# Patient Record
Sex: Male | Born: 1980 | Race: White | Hispanic: No | Marital: Single | Smoking: Current every day smoker
Health system: Southern US, Community
[De-identification: ages and names within clinical notes are randomized; demographics above are authoritative.]

## PROBLEM LIST (undated history)

## (undated) DIAGNOSIS — F4310 Post-traumatic stress disorder, unspecified: Secondary | ICD-10-CM

## (undated) DIAGNOSIS — K37 Unspecified appendicitis: Secondary | ICD-10-CM

## (undated) DIAGNOSIS — F1910 Other psychoactive substance abuse, uncomplicated: Secondary | ICD-10-CM

## (undated) HISTORY — PX: APPENDECTOMY: SHX54

## (undated) HISTORY — PX: ADENOIDECTOMY: SUR15

## (undated) HISTORY — PX: NEPHRECTOMY: SHX65

---

## 2016-05-15 ENCOUNTER — Encounter (HOSPITAL_COMMUNITY): Payer: Self-pay | Admitting: Emergency Medicine

## 2016-05-15 ENCOUNTER — Emergency Department (HOSPITAL_COMMUNITY)
Admission: EM | Admit: 2016-05-15 | Discharge: 2016-05-15 | Disposition: A | Discharge status: Home / Self Care | Payer: Self-pay | Attending: Emergency Medicine | Admitting: Emergency Medicine

## 2016-05-15 ENCOUNTER — Emergency Department (HOSPITAL_COMMUNITY): Payer: Self-pay

## 2016-05-15 DIAGNOSIS — F17210 Nicotine dependence, cigarettes, uncomplicated: Secondary | ICD-10-CM | POA: Insufficient documentation

## 2016-05-15 DIAGNOSIS — F10229 Alcohol dependence with intoxication, unspecified: Secondary | ICD-10-CM | POA: Insufficient documentation

## 2016-05-15 DIAGNOSIS — F1123 Opioid dependence with withdrawal: Secondary | ICD-10-CM | POA: Insufficient documentation

## 2016-05-15 DIAGNOSIS — Z79899 Other long term (current) drug therapy: Secondary | ICD-10-CM | POA: Insufficient documentation

## 2016-05-15 DIAGNOSIS — F1910 Other psychoactive substance abuse, uncomplicated: Secondary | ICD-10-CM

## 2016-05-15 HISTORY — DX: Other psychoactive substance abuse, uncomplicated: F19.10

## 2016-05-15 HISTORY — DX: Unspecified appendicitis: K37

## 2016-05-15 LAB — CBC
HEMATOCRIT: 46 % (ref 39.0–52.0)
HEMOGLOBIN: 16.1 g/dL (ref 13.0–17.0)
MCH: 31.8 pg (ref 26.0–34.0)
MCHC: 35 g/dL (ref 30.0–36.0)
MCV: 90.9 fL (ref 78.0–100.0)
Platelets: 438 10*3/uL — ABNORMAL HIGH (ref 150–400)
RBC: 5.06 MIL/uL (ref 4.22–5.81)
RDW: 13.9 % (ref 11.5–15.5)
WBC: 15.4 10*3/uL — ABNORMAL HIGH (ref 4.0–10.5)

## 2016-05-15 LAB — ETHANOL: Alcohol, Ethyl (B): 404 mg/dL (ref <5)

## 2016-05-15 LAB — COMPREHENSIVE METABOLIC PANEL
ALBUMIN: 4.6 g/dL (ref 3.5–5.0)
ALT: 32 U/L (ref 17–63)
AST: 31 U/L (ref 15–41)
Alkaline Phosphatase: 113 U/L (ref 38–126)
Anion gap: 15 (ref 5–15)
BILIRUBIN TOTAL: 0.5 mg/dL (ref 0.3–1.2)
BUN: 8 mg/dL (ref 6–20)
CHLORIDE: 103 mmol/L (ref 101–111)
CO2: 29 mmol/L (ref 22–32)
Calcium: 9.1 mg/dL (ref 8.9–10.3)
Creatinine, Ser: 0.59 mg/dL — ABNORMAL LOW (ref 0.61–1.24)
GFR calc Af Amer: >60 mL/min (ref >60)
GFR calc non Af Amer: >60 mL/min (ref >60)
Glucose, Bld: 122 mg/dL — ABNORMAL HIGH (ref 65–99)
POTASSIUM: 3.7 mmol/L (ref 3.5–5.1)
Sodium: 147 mmol/L — ABNORMAL HIGH (ref 135–145)
TOTAL PROTEIN: 8.3 g/dL — AB (ref 6.5–8.1)

## 2016-05-15 LAB — TROPONIN I: Troponin I: <0.03 ng/mL (ref <0.03)

## 2016-05-15 LAB — MAGNESIUM: Magnesium: 2.6 mg/dL — ABNORMAL HIGH (ref 1.7–2.4)

## 2016-05-15 MED ORDER — NAPROXEN 375 MG PO TABS: 375.0000 mg | ORAL_TABLET | Freq: Two times a day (BID) | ORAL | 0 refills | Status: DC | PRN

## 2016-05-15 MED ORDER — LOPERAMIDE HCL 2 MG PO CAPS: 2.0000 mg | ORAL_CAPSULE | Freq: Four times a day (QID) | ORAL | 0 refills | Status: DC | PRN

## 2016-05-15 MED ORDER — ONDANSETRON HCL 4 MG PO TABS: 4.0000 mg | ORAL_TABLET | Freq: Three times a day (TID) | ORAL | 0 refills | Status: AC | PRN

## 2016-05-15 MED ORDER — VITAMIN B-1 100 MG PO TABS: 100.0000 mg | ORAL_TABLET | Freq: Every day | ORAL | 0 refills | Status: DC

## 2016-05-15 MED ORDER — NAPROXEN 375 MG PO TABS: 375.0000 mg | ORAL_TABLET | Freq: Two times a day (BID) | ORAL | 0 refills | Status: AC | PRN

## 2016-05-15 MED ORDER — CLONIDINE HCL 0.2 MG PO TABS
0.1000 mg | ORAL_TABLET | Freq: Two times a day (BID) | ORAL | 0 refills | Status: DC
Start: 2016-05-15 — End: 2016-05-15

## 2016-05-15 MED ORDER — LOPERAMIDE HCL 2 MG PO CAPS: 2.0000 mg | ORAL_CAPSULE | Freq: Four times a day (QID) | ORAL | 0 refills | Status: AC | PRN

## 2016-05-15 MED ORDER — CLONIDINE HCL 0.1 MG PO TABS: 0.1000 mg | ORAL_TABLET | Freq: Two times a day (BID) | ORAL | 0 refills | Status: AC

## 2016-05-15 MED ORDER — ONDANSETRON HCL 4 MG PO TABS: 4.0000 mg | ORAL_TABLET | Freq: Three times a day (TID) | ORAL | 0 refills | Status: DC | PRN

## 2016-05-15 MED ORDER — VITAMIN B-1 100 MG PO TABS: 100.0000 mg | ORAL_TABLET | Freq: Every day | ORAL | 0 refills | Status: AC

## 2016-05-15 NOTE — ED Notes (Signed)
Patient transported to X-ray 

## 2016-05-15 NOTE — ED Notes (Signed)
Pt provided bus pass & directed to bus stop

## 2016-05-15 NOTE — ED Notes (Signed)
Pt states he was [facility] but there are no records seen.  Registration came back and verified patient information.

## 2016-05-15 NOTE — ED Notes (Addendum)
Pt unable to provide urine at this time.  Given scrubs to change into. Pt belongings put in locker 28.

## 2016-05-15 NOTE — Progress Notes (Signed)
CM spoke with pt who [place]cp.  CM discussed and provided written information to assist pt with determining choice for uninsured accepting pcps, discussed the importance of pcp vs EDP services for f/u care, www.needymeds.org, www.goodrx.com, discounted pharmacies and other [place] resources such as [facility] , [facility], affordable care act, financial assistance, uninsured dental services, [place] [facility] assist, DSS and  health department  Reviewed resources for [place] uninsured accepting pcps like [facility], family medicine [facility] [address], [facility], [facility], local urgent care centers, [facility], [facility], general medical clinics, family services of the [place], [facility] urgent care plus others, medication resources, [facility] out [facility]ies and housing Pt voiced understanding and appreciation of resources provided   Provided [facility] contact information Pt states he has recently moved to area 3 months ago

## 2016-05-15 NOTE — ED Notes (Signed)
Pt ambulated in hallway without difficulty, observed by 2 RN's.
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
## 2016-05-15 NOTE — ED Notes (Signed)
Patient resting comfortably.  Equal chest rise and fall noted with non labored breathing

## 2016-05-15 NOTE — ED Triage Notes (Signed)
Pt comes by EMS wants to detox from alcohol, oxycodone, and suboxone. Also complains of left sided abdominal/flank pain.  Denies chest pain.  A&O x4.  Calm and cooperative in route.  [facility] CBG 140.  Last drink about an hour ago (vodka).  Denies use of any other substances.

## 2016-05-15 NOTE — ED Notes (Signed)
Patient now requesting to leave.  Made MD aware.  MD to come speak with patient.

## 2016-06-16 ENCOUNTER — Encounter (HOSPITAL_COMMUNITY): Payer: Self-pay

## 2016-06-16 ENCOUNTER — Emergency Department (HOSPITAL_COMMUNITY): Payer: Self-pay

## 2016-06-16 ENCOUNTER — Emergency Department (HOSPITAL_COMMUNITY)
Admission: EM | Admit: 2016-06-16 | Discharge: 2016-06-16 | Disposition: A | Discharge status: Home / Self Care | Payer: Self-pay | Attending: Emergency Medicine | Admitting: Emergency Medicine

## 2016-06-16 DIAGNOSIS — W268XXA Contact with other sharp object(s), not elsewhere classified, initial encounter: Secondary | ICD-10-CM | POA: Insufficient documentation

## 2016-06-16 DIAGNOSIS — Y999 Unspecified external cause status: Secondary | ICD-10-CM | POA: Insufficient documentation

## 2016-06-16 DIAGNOSIS — F4310 Post-traumatic stress disorder, unspecified: Secondary | ICD-10-CM | POA: Insufficient documentation

## 2016-06-16 DIAGNOSIS — S51812A Laceration without foreign body of left forearm, initial encounter: Secondary | ICD-10-CM | POA: Insufficient documentation

## 2016-06-16 DIAGNOSIS — Z79899 Other long term (current) drug therapy: Secondary | ICD-10-CM | POA: Insufficient documentation

## 2016-06-16 DIAGNOSIS — Y929 Unspecified place or not applicable: Secondary | ICD-10-CM | POA: Insufficient documentation

## 2016-06-16 DIAGNOSIS — F17210 Nicotine dependence, cigarettes, uncomplicated: Secondary | ICD-10-CM | POA: Insufficient documentation

## 2016-06-16 DIAGNOSIS — F1010 Alcohol abuse, uncomplicated: Secondary | ICD-10-CM | POA: Insufficient documentation

## 2016-06-16 DIAGNOSIS — Y939 Activity, unspecified: Secondary | ICD-10-CM | POA: Insufficient documentation

## 2016-06-16 HISTORY — DX: Post-traumatic stress disorder, unspecified: F43.10

## 2016-06-16 LAB — COMPREHENSIVE METABOLIC PANEL
ALK PHOS: 83 U/L (ref 38–126)
ALT: 34 U/L (ref 17–63)
AST: 33 U/L (ref 15–41)
Albumin: 4.7 g/dL (ref 3.5–5.0)
Anion gap: 11 (ref 5–15)
BILIRUBIN TOTAL: 0.9 mg/dL (ref 0.3–1.2)
BUN: 12 mg/dL (ref 6–20)
CALCIUM: 8.7 mg/dL — AB (ref 8.9–10.3)
CO2: 26 mmol/L (ref 22–32)
CREATININE: 0.46 mg/dL — AB (ref 0.61–1.24)
Chloride: 105 mmol/L (ref 101–111)
GFR calc non Af Amer: >60 mL/min (ref >60)
Glucose, Bld: 103 mg/dL — ABNORMAL HIGH (ref 65–99)
Potassium: 3.9 mmol/L (ref 3.5–5.1)
SODIUM: 142 mmol/L (ref 135–145)
TOTAL PROTEIN: 8.1 g/dL (ref 6.5–8.1)

## 2016-06-16 LAB — RAPID URINE DRUG SCREEN, HOSP PERFORMED
Amphetamines: NOT DETECTED
Barbiturates: NOT DETECTED
Benzodiazepines: NOT DETECTED
Cocaine: NOT DETECTED
OPIATES: NOT DETECTED
Tetrahydrocannabinol: NOT DETECTED

## 2016-06-16 LAB — CBC
HCT: 39.5 % (ref 39.0–52.0)
Hemoglobin: 13 g/dL (ref 13.0–17.0)
MCH: 30.7 pg (ref 26.0–34.0)
MCHC: 32.9 g/dL (ref 30.0–36.0)
MCV: 93.4 fL (ref 78.0–100.0)
PLATELETS: 221 10*3/uL (ref 150–400)
RBC: 4.23 MIL/uL (ref 4.22–5.81)
RDW: 16.7 % — AB (ref 11.5–15.5)
WBC: 7.6 10*3/uL (ref 4.0–10.5)

## 2016-06-16 LAB — SALICYLATE LEVEL

## 2016-06-16 LAB — ETHANOL: Alcohol, Ethyl (B): 313 mg/dL (ref <5)

## 2016-06-16 LAB — ACETAMINOPHEN LEVEL: Acetaminophen (Tylenol), Serum: <10 ug/mL — ABNORMAL LOW (ref 10–30)

## 2016-06-16 MED ORDER — LORAZEPAM 1 MG PO TABS: 0.0000 mg | ORAL_TABLET | Freq: Two times a day (BID) | ORAL | Status: DC

## 2016-06-16 MED ORDER — LORAZEPAM 1 MG PO TABS
0.0000 mg | ORAL_TABLET | Freq: Four times a day (QID) | ORAL | Status: DC
  Administered 2016-06-16: 1 mg via ORAL
  Filled 2016-06-16: qty 1
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
## 2016-06-16 NOTE — [facility] Counselor (Signed)
Clinician discussed discharge and provided the pt with OPT resources for medication management, counseling and substance abuse treatment. Pt also signed a [employer] contract, and it was discussed if the pt's symptoms continued to worsen to follow up with mobile crisis or call 911.   Gwinda Passereylese D Bennett, MS, [facility], [facility] Triage Specialist [phone]
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
## 2016-06-16 NOTE — ED Notes (Signed)
TTS at bedside. 

## 2016-06-16 NOTE — ED Notes (Signed)
ED Provider at bedside. 

## 2016-06-16 NOTE — ED Notes (Signed)
Belongings returned to pt

## 2016-06-16 NOTE — ED Notes (Signed)
Pt ambulating without difficulty.

## 2016-06-16 NOTE — ED Notes (Signed)
Patient transported to X-ray 

## 2016-06-16 NOTE — ED Notes (Signed)
ED Provider at bedside. PT INFORMED NOT TO SMOKE IN BATHROOM. BELONGING REMOVED FROM REMOVE AND PT CLEARED BY SECURITY

## 2016-06-16 NOTE — ED Notes (Signed)
PT CAUGHT SMOKING IN BATHROOM FOR SECOND TIME.Marland Kitchen. PT ENCOURAGED TO REMOVE ALL BELONGINGS AND HAVE THEM LOCKED INTO PERSONAL LOCKER. PT AGREES AND UNDERSTANDS POLICY.
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
## 2016-09-22 ENCOUNTER — Encounter (HOSPITAL_COMMUNITY): Payer: Self-pay

## 2016-09-22 ENCOUNTER — Emergency Department (HOSPITAL_COMMUNITY)
Admission: EM | Admit: 2016-09-22 | Discharge: 2016-10-08 | Disposition: E | Payer: Self-pay | Attending: Emergency Medicine | Admitting: Emergency Medicine

## 2016-09-22 DIAGNOSIS — F17210 Nicotine dependence, cigarettes, uncomplicated: Secondary | ICD-10-CM | POA: Insufficient documentation

## 2016-09-22 DIAGNOSIS — I469 Cardiac arrest, cause unspecified: Secondary | ICD-10-CM | POA: Insufficient documentation

## 2016-09-22 DIAGNOSIS — Z79899 Other long term (current) drug therapy: Secondary | ICD-10-CM | POA: Insufficient documentation

## 2016-09-22 NOTE — Procedures (Signed)
Pt arrived to ER on lucas machine, #8 ETT in place 24@ lip, placement confirmed by MD.   ETT removed per MD order.
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
## 2016-09-22 NOTE — ED Notes (Signed)
[id number] referral number  [facility]  Suitable for tissues and eye

## 2016-10-08 DEATH — deceased

## 2017-02-07 IMAGING — CR DG CHEST 2V
2 series · 2 of 2 positions shown · non-contrast
Comparison: None.

CLINICAL DATA: Chest pain for 24 hours

EXAM:
CHEST  2 VIEW

[w chest pa]
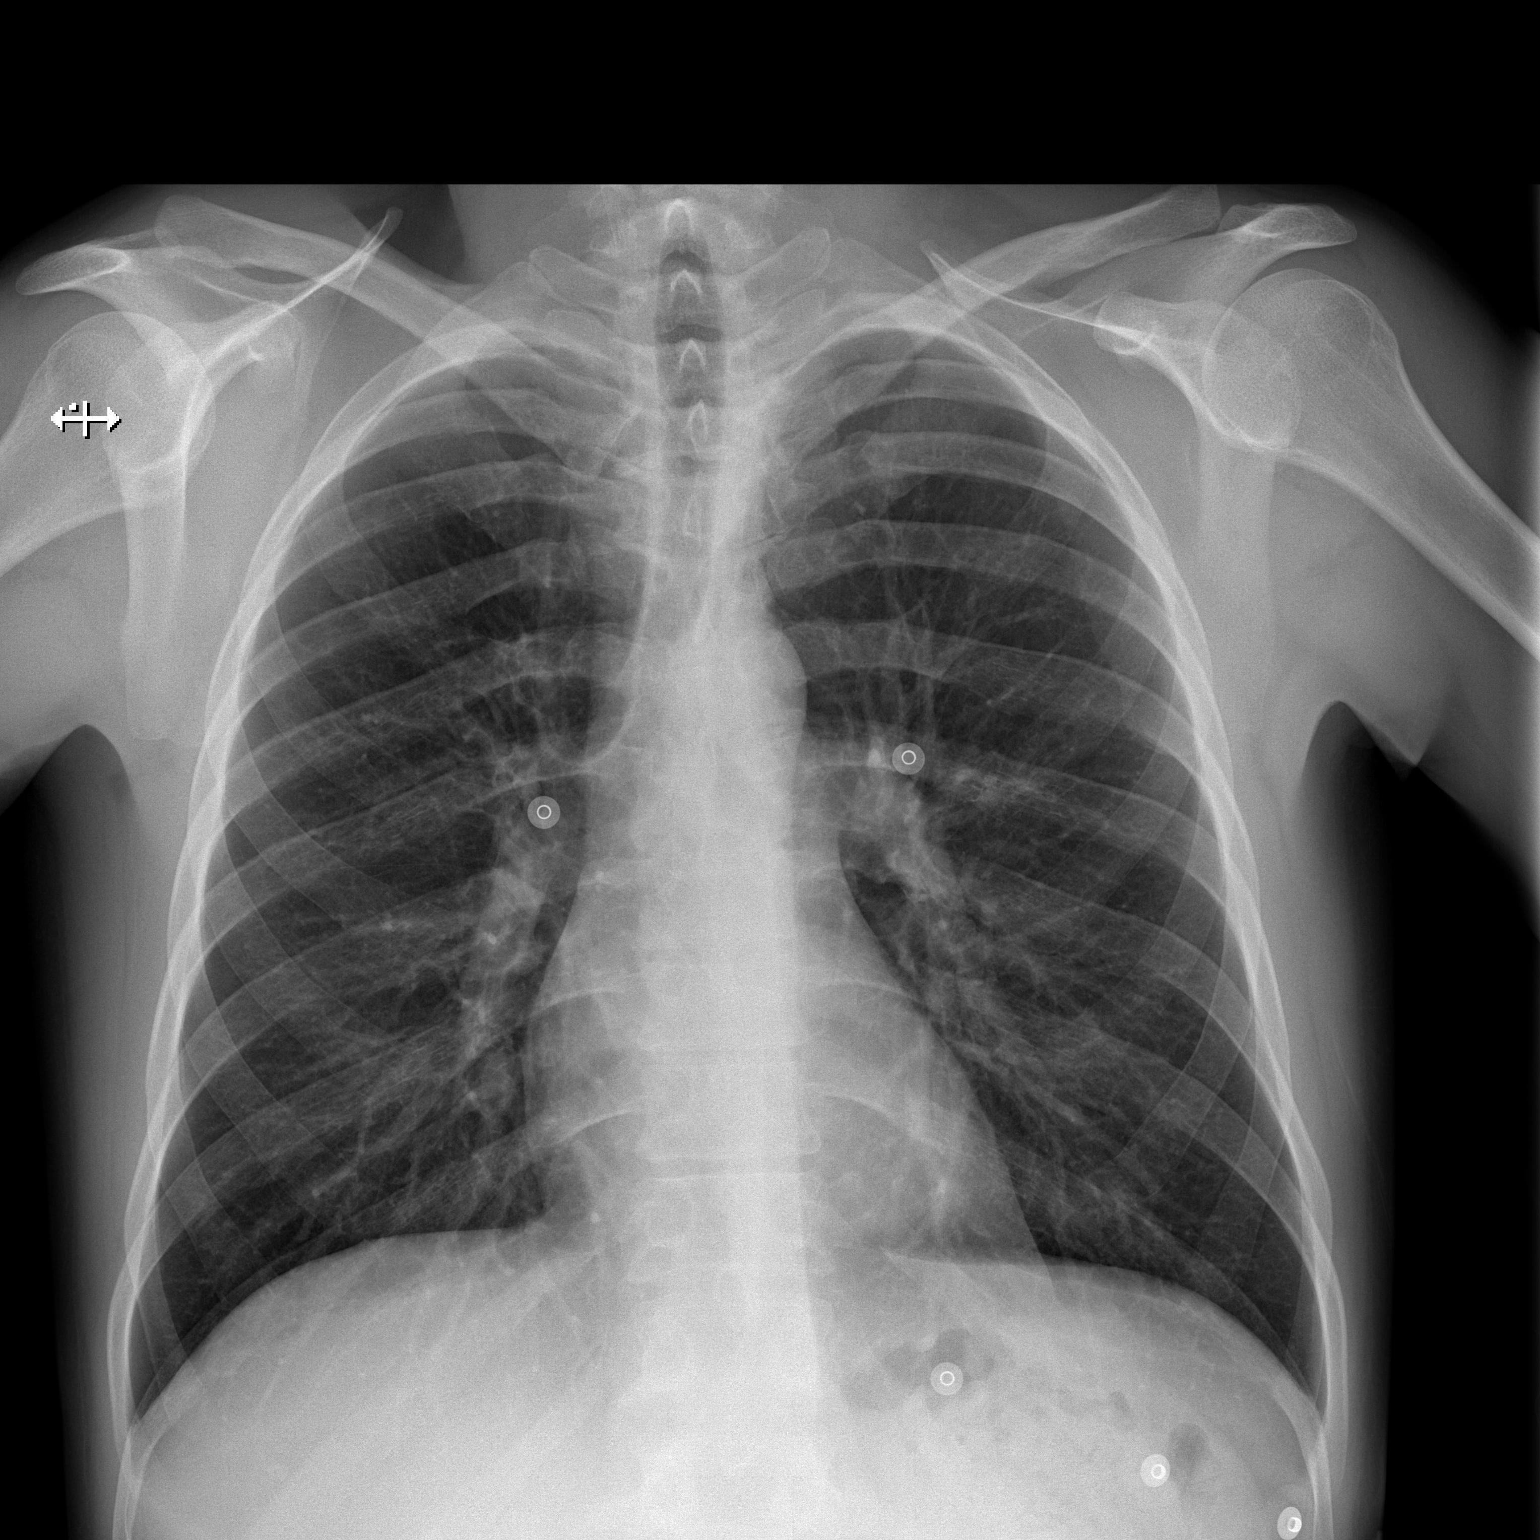

[w chest lat]
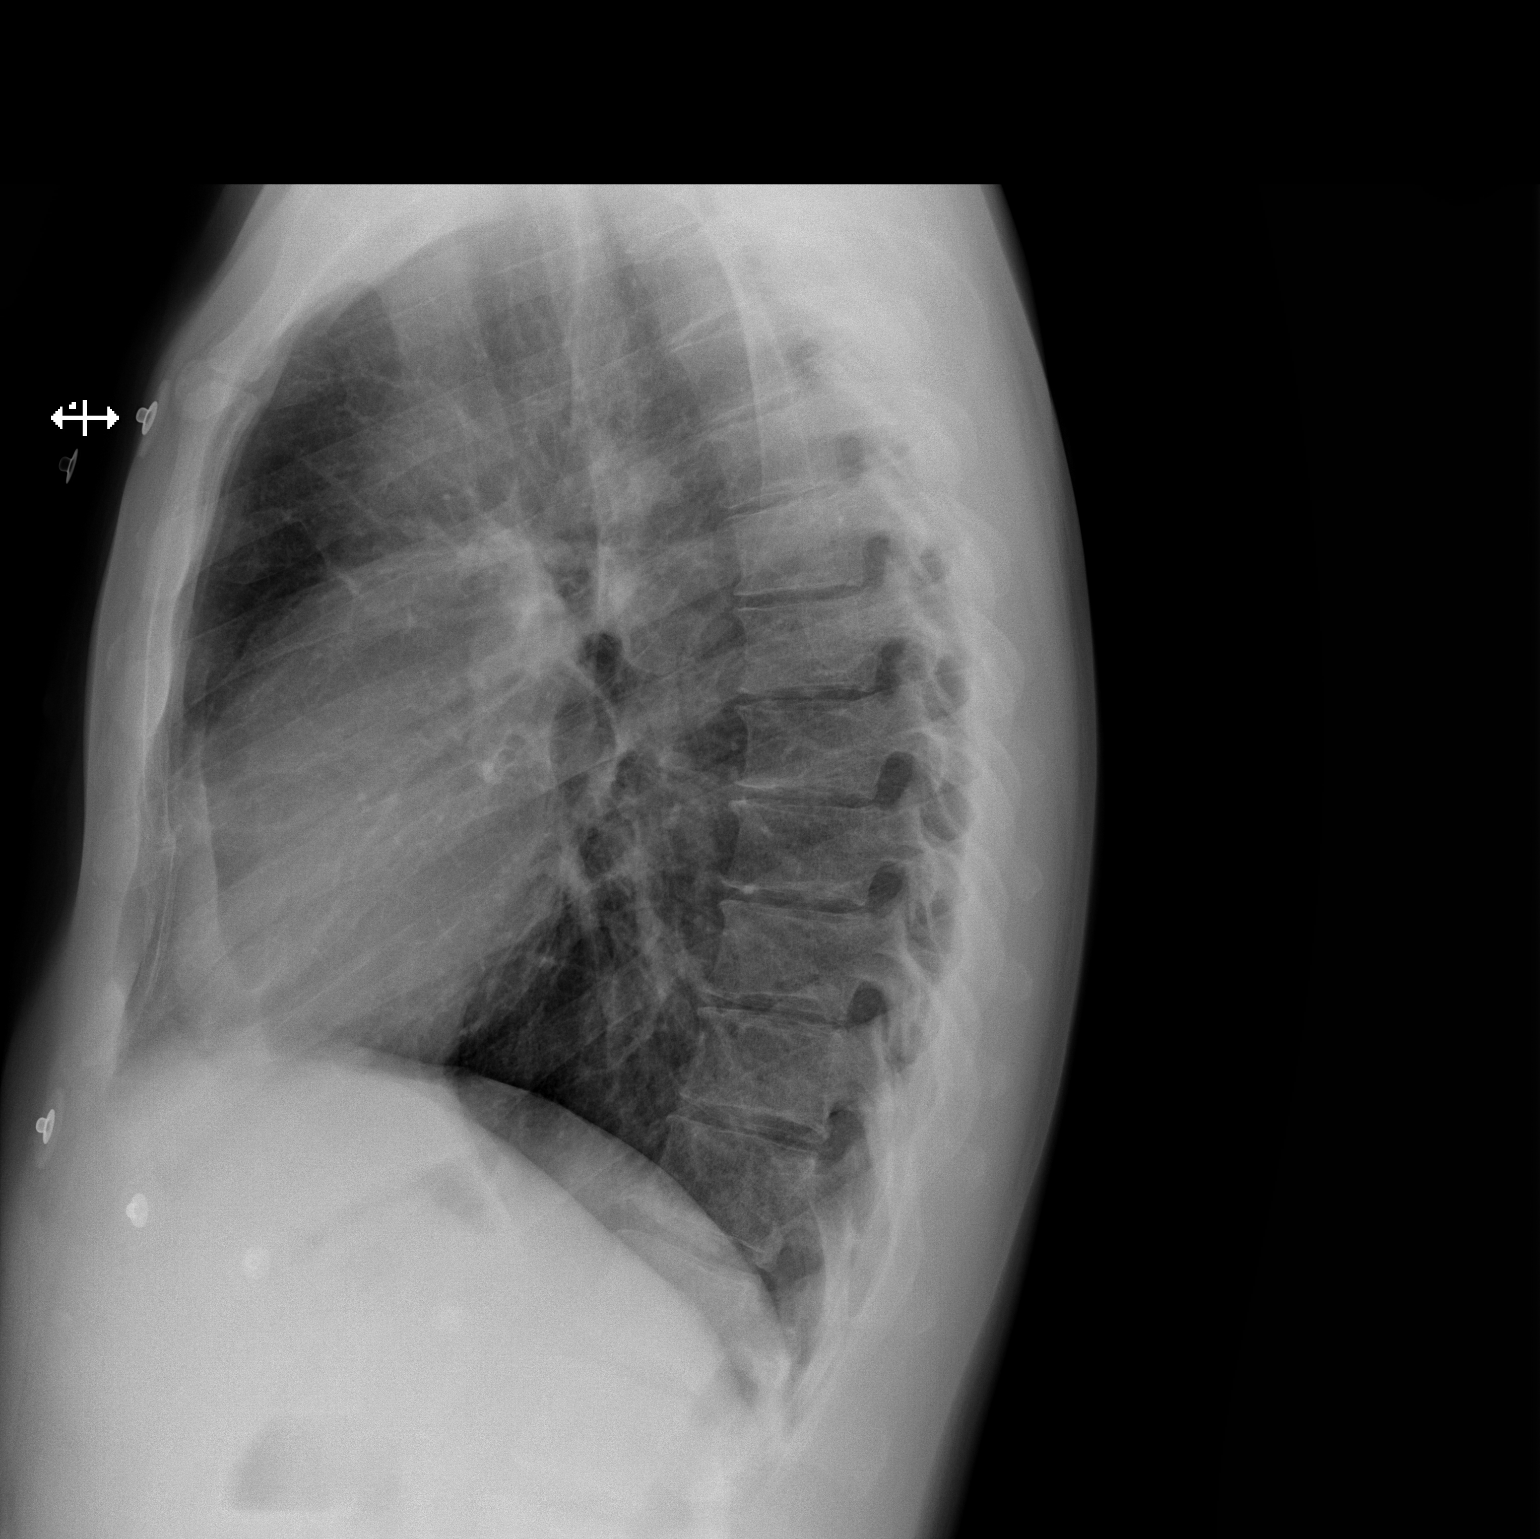

[2 of 2 positions shown; findings below may reference images not displayed]

FINDINGS: The heart size and mediastinal contours are within normal limits.
Both lungs are clear. The visualized skeletal structures are
unremarkable.
IMPRESSION: No active cardiopulmonary disease.

## 2017-03-11 IMAGING — CR DG CHEST 2V
2 series · 2 of 2 positions shown · non-contrast
Comparison: May 15, 2016

CLINICAL DATA: Cough for 2 weeks

EXAM:
CHEST  2 VIEW

[w chest pa]
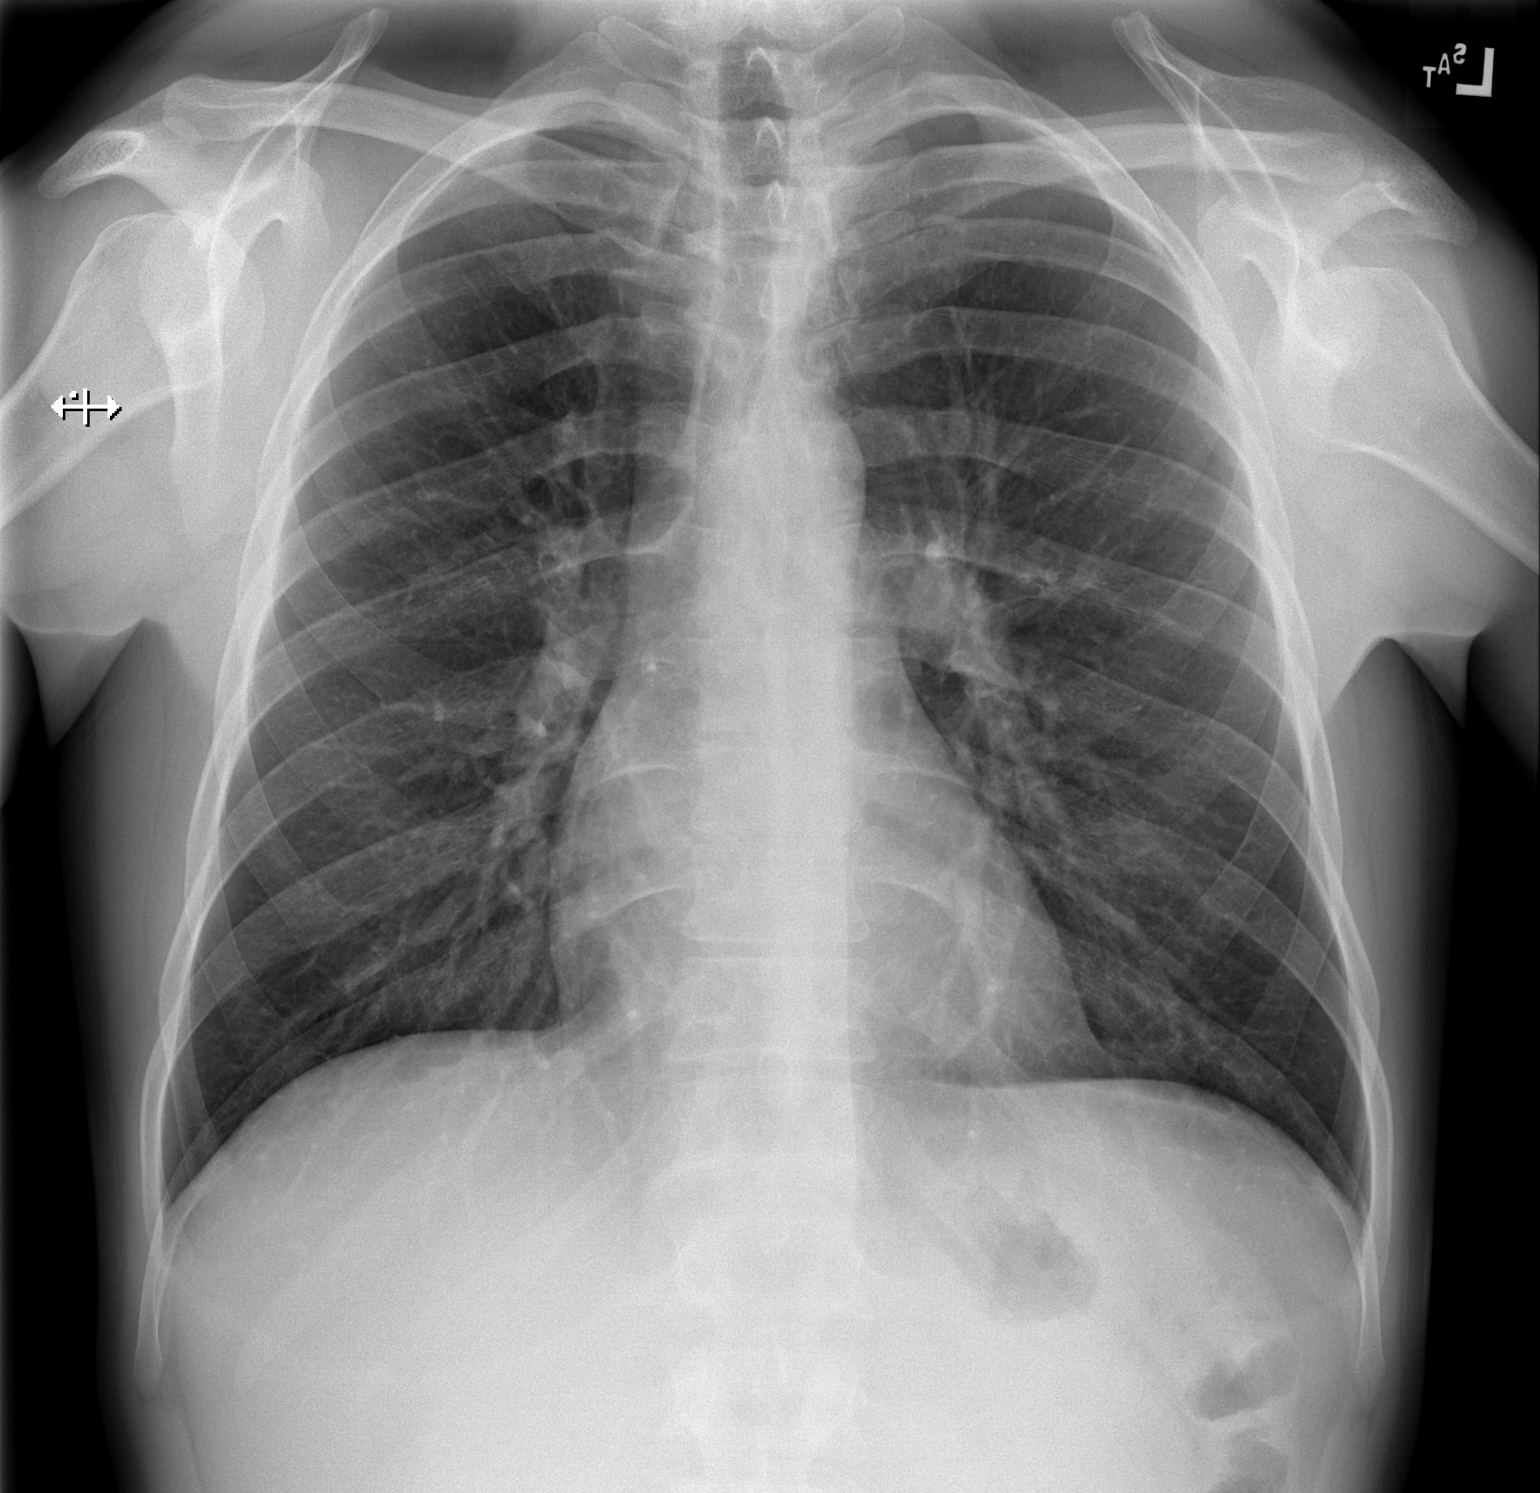

[w chest lat]
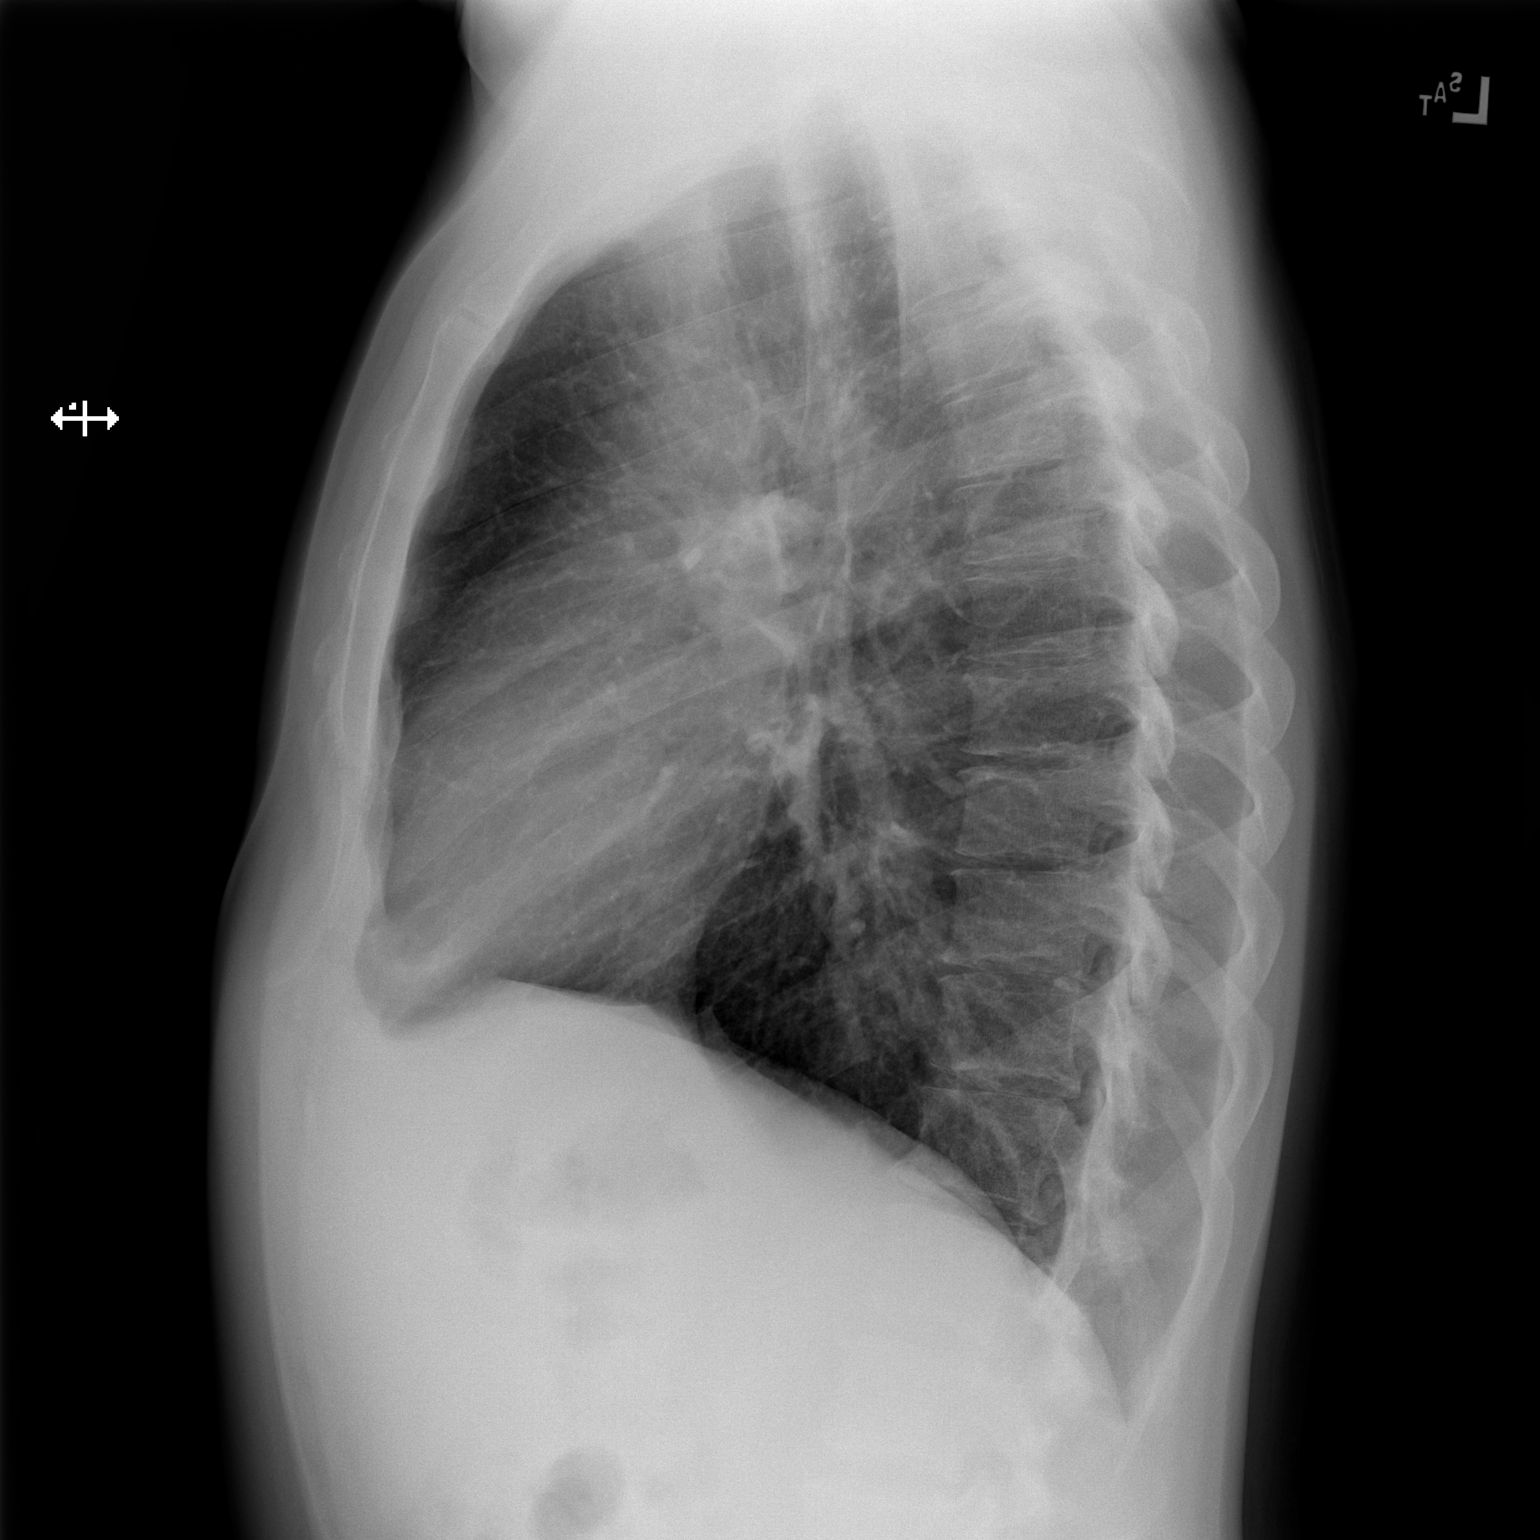

[2 of 2 positions shown; findings below may reference images not displayed]

FINDINGS: There is no edema or consolidation. Heart size and pulmonary
vascularity are normal. No adenopathy. No bone lesions.
IMPRESSION: No edema or consolidation.
